# Patient Record
Sex: Male | Born: 1980 | Race: Black or African American | Hispanic: No | Marital: Married | Smoking: Never smoker
Health system: Southern US, Community
[De-identification: ages and names within clinical notes are randomized; demographics above are authoritative.]

---

## 2005-09-27 ENCOUNTER — Emergency Department (HOSPITAL_COMMUNITY): Admission: EM | Admit: 2005-09-27 | Discharge: 2005-09-27 | Payer: Self-pay | Admitting: Family Medicine

## 2007-01-03 IMAGING — CR DG ANKLE COMPLETE 3+V*L*
3 series · 3 of 3 positions shown · non-contrast
Comparison: none

CLINICAL DATA: Patient fell one year ago.  Fell again 3 weeks ago, twisting left ankle, pain lateral ankle.
LEFT ANKLE ? 3 VIEW:

[view not recorded (1 of 3)]
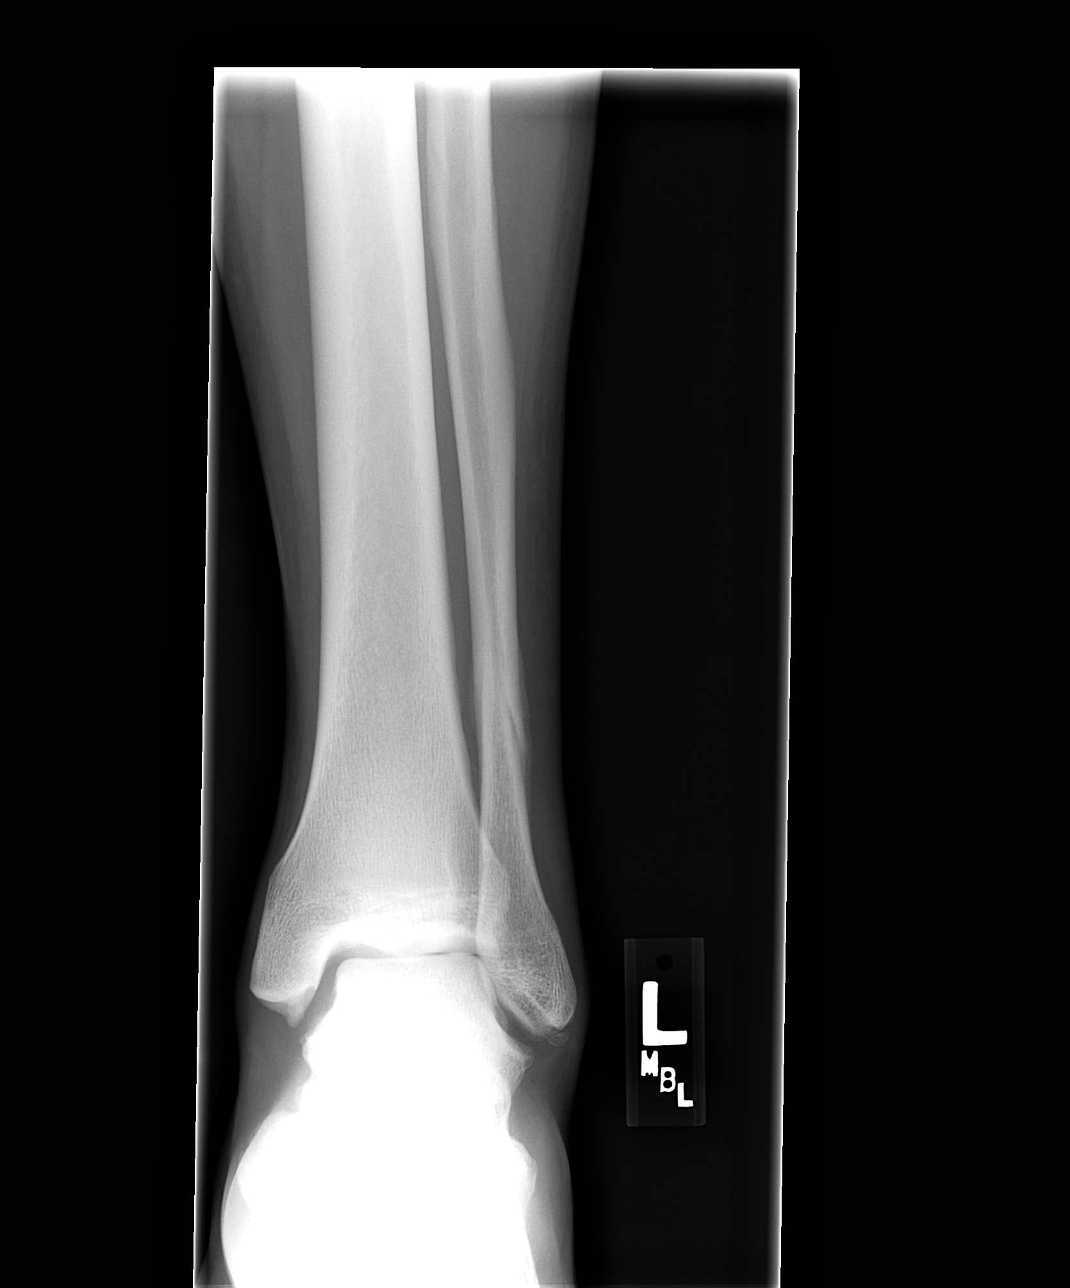

[view not recorded (2 of 3)]
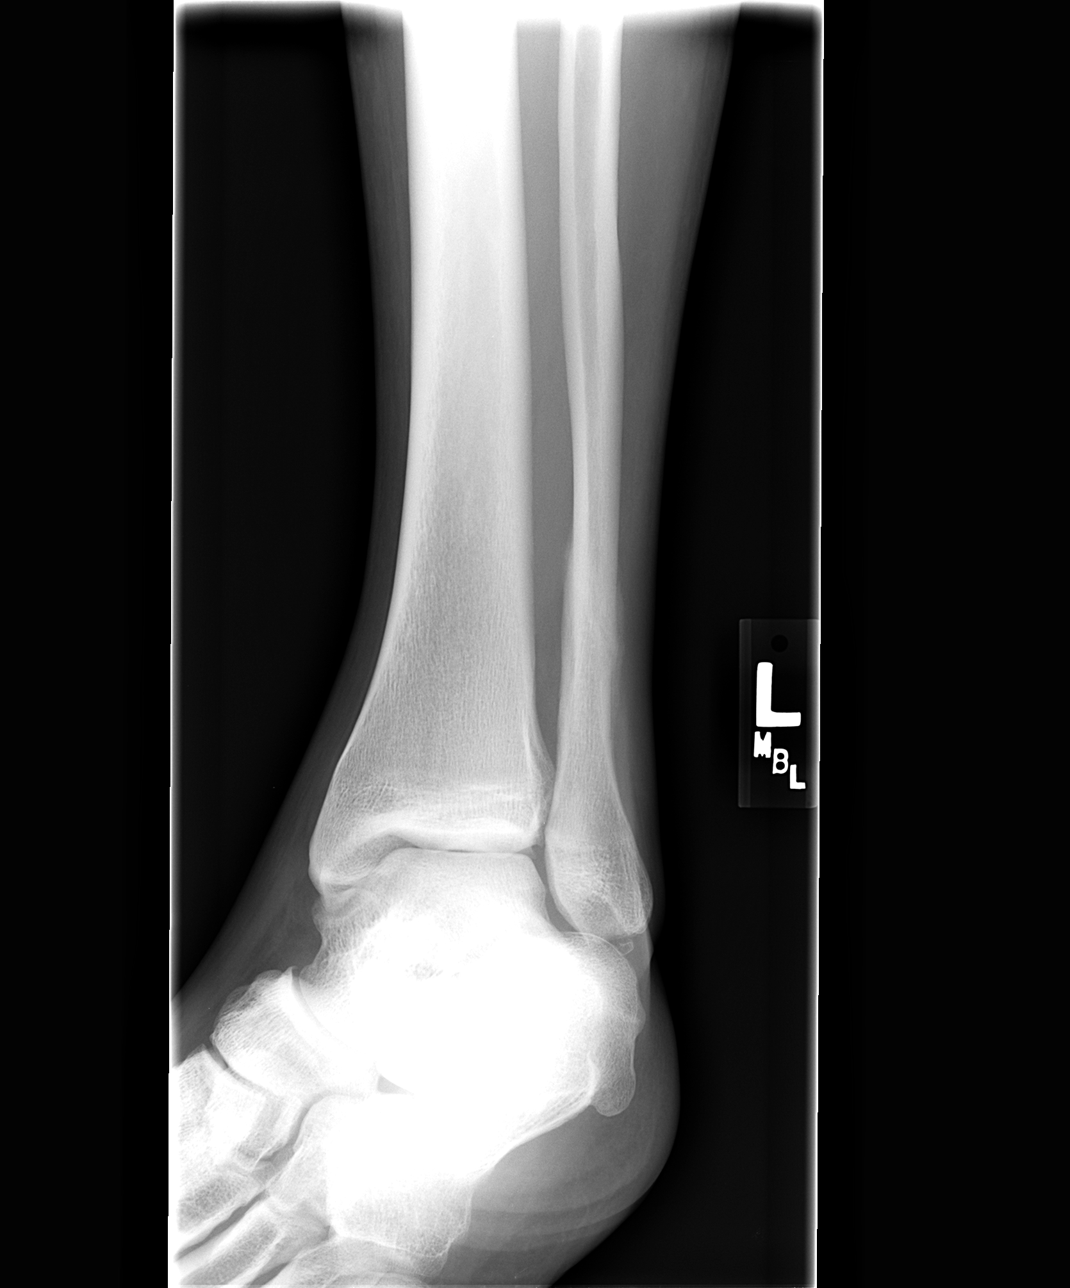

[view not recorded (3 of 3)]
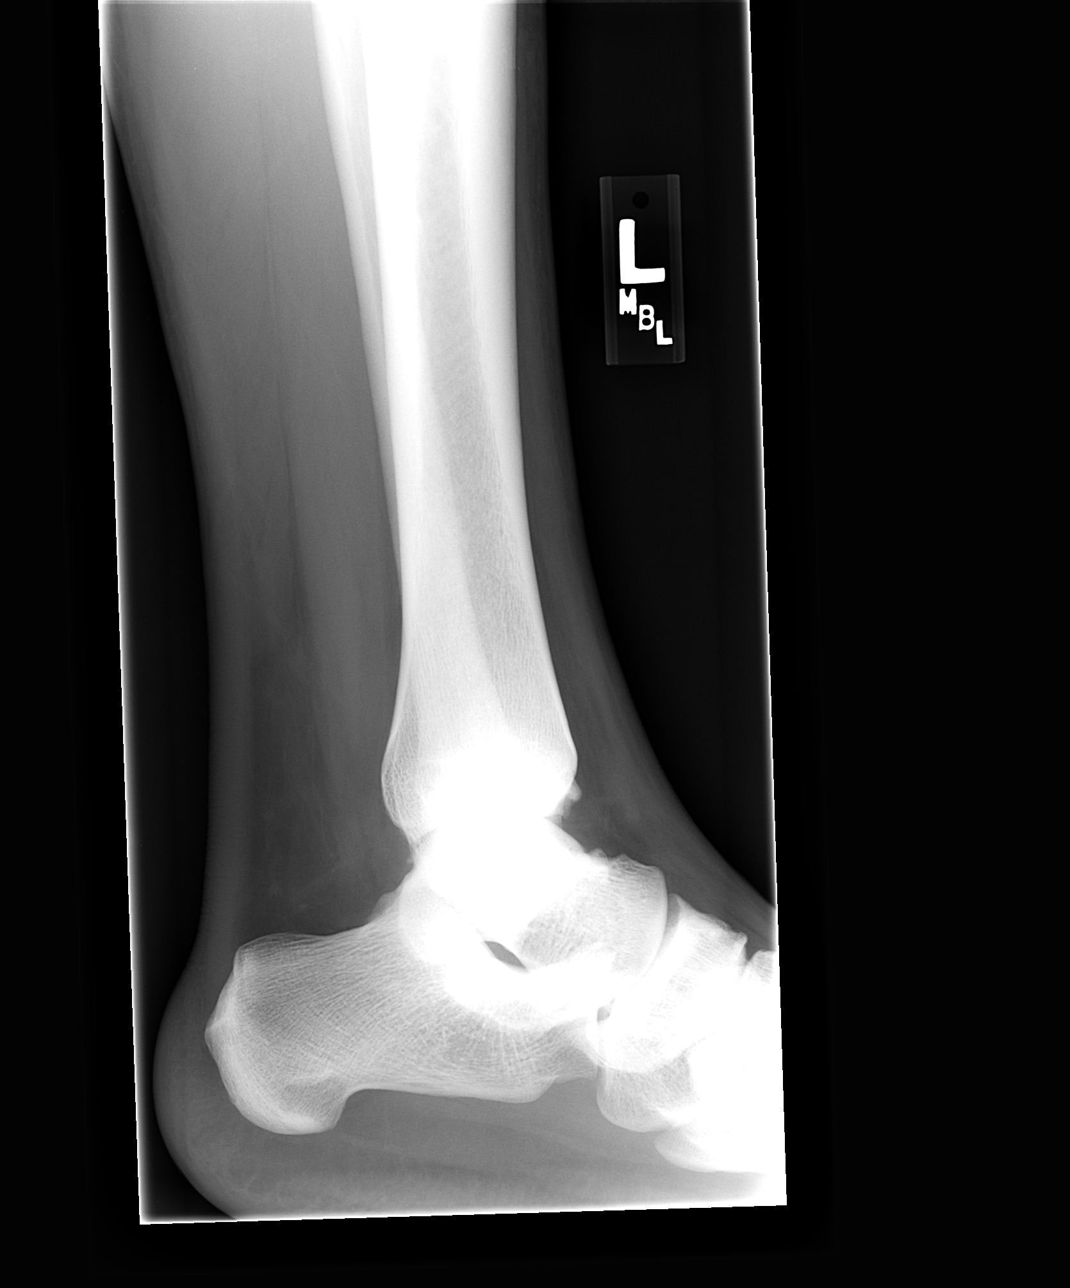

[3 of 3 positions shown; findings below may reference images not displayed]

FINDINGS: Healing fracture distal fibular shaft.  Fracture line is still faintly visible through the lateral cortex.  Small well corticated bony density adjacent to the tip of the lateral malleolus compatible with unfused remote avulsion fracture fragment or accessory ossification center/ossicle.
IMPRESSION: Findings compatible with remote injuries of the left fibula including healing fracture of the distal fibular shaft and remote cortical avulsion fracture of the tip of the lateral malleolus.  No acute bony abnormality.

## 2010-08-23 ENCOUNTER — Emergency Department (HOSPITAL_COMMUNITY): Admission: EM | Admit: 2010-08-23 | Discharge: 2010-08-23 | Payer: Self-pay | Admitting: Emergency Medicine

## 2013-10-20 ENCOUNTER — Other Ambulatory Visit: Payer: Self-pay | Admitting: Sports Medicine

## 2013-10-20 DIAGNOSIS — Z202 Contact with and (suspected) exposure to infections with a predominantly sexual mode of transmission: Secondary | ICD-10-CM | POA: Insufficient documentation

## 2013-10-20 MED ORDER — AZITHROMYCIN 500 MG PO TABS: 1000.0000 mg | ORAL_TABLET | Freq: Once | ORAL | Status: DC

## 2013-10-20 MED ORDER — CEFIXIME 400 MG PO TABS: 400.0000 mg | ORAL_TABLET | Freq: Once | ORAL | Status: DC

## 2013-10-20 NOTE — Assessment & Plan Note (Signed)
With possible exposure to STD, and partner tested positive for Chlamydia, empirically treating with azithromycin and Suprax. Return as needed.

## 2014-09-14 ENCOUNTER — Encounter: Payer: Self-pay | Admitting: Sports Medicine

## 2015-03-19 ENCOUNTER — Encounter: Payer: Self-pay | Admitting: Sports Medicine

## 2015-03-19 ENCOUNTER — Ambulatory Visit (INDEPENDENT_AMBULATORY_CARE_PROVIDER_SITE_OTHER): Payer: Self-pay | Admitting: Sports Medicine

## 2015-03-19 VITALS — BP 126/81 | HR 51 | Ht 77.0 in | Wt 239.0 lb

## 2015-03-19 DIAGNOSIS — E785 Hyperlipidemia, unspecified: Secondary | ICD-10-CM

## 2015-03-19 DIAGNOSIS — Z0000 Encounter for general adult medical examination without abnormal findings: Secondary | ICD-10-CM | POA: Insufficient documentation

## 2015-03-19 DIAGNOSIS — M7651 Patellar tendinitis, right knee: Secondary | ICD-10-CM

## 2015-03-19 DIAGNOSIS — N469 Male infertility, unspecified: Secondary | ICD-10-CM | POA: Insufficient documentation

## 2015-03-19 DIAGNOSIS — E291 Testicular hypofunction: Secondary | ICD-10-CM

## 2015-03-19 DIAGNOSIS — Z3009 Encounter for other general counseling and advice on contraception: Secondary | ICD-10-CM

## 2015-03-19 NOTE — Assessment & Plan Note (Signed)
Healthy male, checking routine blood work.  

## 2015-03-19 NOTE — Assessment & Plan Note (Signed)
Cho-Pat strap, rehabilitation exercises. Return as needed.

## 2015-03-19 NOTE — Assessment & Plan Note (Signed)
Discussed rhythm method. The been trying now for 4 months, after year of trying with the rhythm method, if still insufficient response, then we will do a semen analysis. I am going to check his testosterone levels.

## 2015-03-19 NOTE — Progress Notes (Signed)
  Subjective:    CC: Establish care.   HPI:  Ryan Mack is a pleasant 34 year old male. He is healthy.  Right knee pain: Localized over the proximal patellar tendon, worse with squatting, going up and down stairs, moderate, persistent, hasn't had much treatment.  Family planning: Trying to have a baby with his wife, they have been somewhat confused as to the ideal timing.  Past medical history, Surgical history, Family history not pertinant except as noted below, Social history, Allergies, and medications have been entered into the medical record, reviewed, and no changes needed.   Review of Systems: No headache, visual changes, nausea, vomiting, diarrhea, constipation, dizziness, abdominal pain, skin rash, fevers, chills, night sweats, swollen lymph nodes, weight loss, chest pain, body aches, joint swelling, muscle aches, shortness of breath, mood changes, visual or auditory hallucinations.  Objective:    General: Well Developed, well nourished, and in no acute distress.  Neuro: Alert and oriented x3, extra-ocular muscles intact, sensation grossly intact.  HEENT: Normocephalic, atraumatic, pupils equal round reactive to light, neck supple, no masses, no lymphadenopathy, thyroid nonpalpable.  Skin: Warm and dry, no rashes noted.  Cardiac: Regular rate and rhythm, no murmurs rubs or gallops.  Respiratory: Clear to auscultation bilaterally. Not using accessory muscles, speaking in full sentences.  Abdominal: Soft, nontender, nondistended, positive bowel sounds, no masses, no organomegaly.  Right Knee: Normal to inspection with no erythema or effusion or obvious bony abnormalities. Palpation normal with no warmth or joint line tenderness or patellar tenderness or condyle tenderness. ROM normal in flexion and extension and lower leg rotation. Ligaments with solid consistent endpoints including ACL, PCL, LCL, MCL. Negative Mcmurray's and provocative meniscal tests. Non painful patellar  compression. Tender to palpation at the proximal origin of the patellar tendon Hamstring and quadriceps strength is normal.  Impression and Recommendations:    The patient was counselled, risk factors were discussed, anticipatory guidance given.

## 2015-03-20 LAB — LUTEINIZING HORMONE: LH: 10.1 m[IU]/mL — ABNORMAL HIGH (ref 1.5–9.3)

## 2015-03-20 LAB — LIPID PANEL
Cholesterol: 250 mg/dL — ABNORMAL HIGH (ref 0–200)
HDL: 51 mg/dL (ref >=40)
LDL Cholesterol: 185 mg/dL — ABNORMAL HIGH (ref 0–99)
Total CHOL/HDL Ratio: 4.9 ratio
Triglycerides: 72 mg/dL (ref <150)
VLDL: 14 mg/dL (ref 0–40)

## 2015-03-20 LAB — COMPREHENSIVE METABOLIC PANEL WITH GFR
Albumin: 4.5 g/dL (ref 3.5–5.2)
Alkaline Phosphatase: 52 U/L (ref 39–117)
CO2: 28 meq/L (ref 19–32)
Calcium: 10.7 mg/dL — ABNORMAL HIGH (ref 8.4–10.5)
Creat: 1.07 mg/dL (ref 0.50–1.35)
Glucose, Bld: 89 mg/dL (ref 70–99)
Potassium: 4.3 meq/L (ref 3.5–5.3)
Sodium: 136 meq/L (ref 135–145)
Total Protein: 7.5 g/dL (ref 6.0–8.3)

## 2015-03-20 LAB — CBC
HCT: 44.8 % (ref 39.0–52.0)
Hemoglobin: 15.3 g/dL (ref 13.0–17.0)
MCH: 31.1 pg (ref 26.0–34.0)
MCHC: 34.2 g/dL (ref 30.0–36.0)
MCV: 91.1 fL (ref 78.0–100.0)
MPV: 10.1 fL (ref 8.6–12.4)
Platelets: 210 K/uL (ref 150–400)
RBC: 4.92 MIL/uL (ref 4.22–5.81)
RDW: 14.4 % (ref 11.5–15.5)
WBC: 4.3 10*3/uL (ref 4.0–10.5)

## 2015-03-20 LAB — COMPREHENSIVE METABOLIC PANEL
ALT: 21 U/L (ref 0–53)
AST: 22 U/L (ref 0–37)
BUN: 18 mg/dL (ref 6–23)
Chloride: 105 mEq/L (ref 96–112)
Total Bilirubin: 0.7 mg/dL (ref 0.2–1.2)

## 2015-03-20 LAB — TSH: TSH: 1.439 u[IU]/mL (ref 0.350–4.500)

## 2015-03-20 LAB — HEMOGLOBIN A1C
Hgb A1c MFr Bld: 6.1 % — ABNORMAL HIGH (ref <5.7)
Mean Plasma Glucose: 128 mg/dL — ABNORMAL HIGH (ref <117)

## 2015-03-20 LAB — FOLLICLE STIMULATING HORMONE: FSH: 17.1 m[IU]/mL (ref 1.4–18.1)

## 2015-03-20 LAB — VITAMIN D 25 HYDROXY (VIT D DEFICIENCY, FRACTURES): Vit D, 25-Hydroxy: 28 ng/mL — ABNORMAL LOW (ref 30–100)

## 2015-03-22 DIAGNOSIS — E291 Testicular hypofunction: Secondary | ICD-10-CM | POA: Insufficient documentation

## 2015-03-22 DIAGNOSIS — E785 Hyperlipidemia, unspecified: Secondary | ICD-10-CM | POA: Insufficient documentation

## 2015-03-22 LAB — TESTOSTERONE, FREE, TOTAL, SHBG
Sex Hormone Binding: 28 nmol/L (ref 10–50)
Testosterone, Free: 42.2 pg/mL — ABNORMAL LOW (ref 47.0–244.0)
Testosterone-% Free: 2.1 % (ref 1.6–2.9)
Testosterone: 202 ng/dL — ABNORMAL LOW (ref 300–890)

## 2015-03-22 NOTE — Assessment & Plan Note (Signed)
Elevated LH, low testosterone. Rechecking morning values for these.

## 2015-03-22 NOTE — Addendum Note (Signed)
Addended by: Monica BectonHEKKEKANDAM, THOMAS J on: 03/22/2015 08:42 AM   Modules accepted: Orders

## 2015-04-30 ENCOUNTER — Ambulatory Visit: Payer: Self-pay | Admitting: Sports Medicine

## 2015-11-16 ENCOUNTER — Other Ambulatory Visit: Payer: Self-pay | Admitting: Sports Medicine

## 2015-11-16 DIAGNOSIS — N469 Male infertility, unspecified: Secondary | ICD-10-CM

## 2015-11-16 DIAGNOSIS — Z3009 Encounter for other general counseling and advice on contraception: Secondary | ICD-10-CM

## 2015-12-07 ENCOUNTER — Telehealth: Payer: Self-pay | Admitting: Sports Medicine

## 2015-12-07 DIAGNOSIS — N469 Male infertility, unspecified: Secondary | ICD-10-CM

## 2015-12-07 NOTE — Telephone Encounter (Signed)
Patient with a possible history of anabolic steroid use, semen analysis shows a low number of motile sperm at 3,000,000/mL, poor forward progression, a high percentage of non-motile sperm, and low overall motility. If there has been any difficulty achieving pregnancy and his partner we can consider FSH and LH injections, as well as complete abstinence from anabolic steroids.

## 2015-12-08 NOTE — Telephone Encounter (Signed)
COntact number incorrect in the system will send a letter with results.

## 2018-08-07 ENCOUNTER — Encounter: Payer: Self-pay | Admitting: Family Medicine

## 2018-08-07 ENCOUNTER — Ambulatory Visit (INDEPENDENT_AMBULATORY_CARE_PROVIDER_SITE_OTHER): Payer: Managed Care, Other (non HMO) | Admitting: Family Medicine

## 2018-08-07 VITALS — BP 136/84 | Ht 77.0 in | Wt 222.0 lb

## 2018-08-07 DIAGNOSIS — M722 Plantar fascial fibromatosis: Secondary | ICD-10-CM

## 2018-08-07 NOTE — Patient Instructions (Signed)
You have plantar fasciitis Take tylenol and/or aleve as needed for pain  Plantar fascia stretch for 20-30 seconds (do 3 of these) in morning Lowering/raise on a step exercises 3 x 10 once or twice a day - this is very important for long term recovery. Can add heel walks, toe walks forward and backward as well Ice heel for 15 minutes as needed. Avoid flat shoes/barefoot walking as much as possible. Arch straps have been shown to help with pain. Inserts are important (spencos, our green insoles, custom orthotics). Steroid injection is a consideration for short term pain relief if you are struggling. Physical therapy is also an option. Consider night splints. Follow up with me or another sports medicine physician in 6 weeks.

## 2018-08-08 ENCOUNTER — Encounter: Payer: Self-pay | Admitting: Family Medicine

## 2018-08-08 NOTE — Progress Notes (Signed)
PCP: No primary care provider on file.  Subjective:   HPI:  Patient is a 37 y.o. male here for right heel pain.  Patient reports while playing basketball about a year and a half ago he recalls feeling like something in bottom of right foot snapped and he heard a pop. Didn't look to see if there was swelling or bruising at the time. He iced and rested this. Then reaggravated this about 2-3 months later. Thinks past 6 months this has been worse though, feels more in the morning and can be sharp back at the heel. No skin changes, numbness.  History reviewed. No pertinent past medical history.  No current outpatient medications on file prior to visit.   No current facility-administered medications on file prior to visit.     History reviewed. No pertinent surgical history.  No Known Allergies  Social History   Socioeconomic History  . Marital status: Married    Spouse name: Not on file  . Number of children: Not on file  . Years of education: Not on file  . Highest education level: Not on file  Occupational History  . Not on file  Social Needs  . Financial resource strain: Not on file  . Food insecurity:    Worry: Not on file    Inability: Not on file  . Transportation needs:    Medical: Not on file    Non-medical: Not on file  Tobacco Use  . Smoking status: Never Smoker  . Smokeless tobacco: Never Used  Substance and Sexual Activity  . Alcohol use: Not on file  . Drug use: Not on file  . Sexual activity: Not on file  Lifestyle  . Physical activity:    Days per week: Not on file    Minutes per session: Not on file  . Stress: Not on file  Relationships  . Social connections:    Talks on phone: Not on file    Gets together: Not on file    Attends religious service: Not on file    Active member of club or organization: Not on file    Attends meetings of clubs or organizations: Not on file    Relationship status: Not on file  . Intimate partner violence:   Fear of current or ex partner: Not on file    Emotionally abused: Not on file    Physically abused: Not on file    Forced sexual activity: Not on file  Other Topics Concern  . Not on file  Social History Narrative  . Not on file    History reviewed. No pertinent family history.  BP 136/84   Ht 6\' 5"  (1.956 m)   Wt 222 lb (100.7 kg)   BMI 26.33 kg/m   Review of Systems: See HPI above.     Objective:  Physical Exam:  Gen: NAD, comfortable in exam room  Right foot/ankle: No gross deformity, swelling, ecchymoses FROM with 5/5 strength all directions. TTP at plantar fascia insertion on medial calcaneus.  No other tenderness. Negative calcaneal squeeze. Negative ant drawer and talar tilt.   Negative syndesmotic compression. Thompsons test negative. NV intact distally.  Left foot/ankle: No deformity. FROM with 5/5 strength. No tenderness to palpation. NVI distally.   Assessment & Plan:  1. Right plantar fasciitis - shown home exercises and stretches to do daily.  Tylenol and/or aleve as needed for pain.  Arch binders.  Sports insoles provided today with scaphoid pads.  Icing if needed.  Consider injection,  physical therapy, night splints.  F/u in 6 weeks.
